# Patient Record
Sex: Female | Born: 1935 | Race: White | Hispanic: No | Smoking: Never smoker
Health system: Southern US, Community
[De-identification: ages and names within clinical notes are randomized; demographics above are authoritative.]

## PROBLEM LIST (undated history)

## (undated) DIAGNOSIS — R42 Dizziness and giddiness: Secondary | ICD-10-CM

## (undated) DIAGNOSIS — C4490 Unspecified malignant neoplasm of skin, unspecified: Secondary | ICD-10-CM

## (undated) DIAGNOSIS — G4700 Insomnia, unspecified: Secondary | ICD-10-CM

## (undated) DIAGNOSIS — K219 Gastro-esophageal reflux disease without esophagitis: Secondary | ICD-10-CM

## (undated) DIAGNOSIS — I10 Essential (primary) hypertension: Secondary | ICD-10-CM

## (undated) DIAGNOSIS — E039 Hypothyroidism, unspecified: Secondary | ICD-10-CM

## (undated) DIAGNOSIS — G709 Myoneural disorder, unspecified: Secondary | ICD-10-CM

## (undated) DIAGNOSIS — M1990 Unspecified osteoarthritis, unspecified site: Secondary | ICD-10-CM

## (undated) DIAGNOSIS — J42 Unspecified chronic bronchitis: Secondary | ICD-10-CM

## (undated) DIAGNOSIS — I499 Cardiac arrhythmia, unspecified: Secondary | ICD-10-CM

## (undated) DIAGNOSIS — K589 Irritable bowel syndrome without diarrhea: Secondary | ICD-10-CM

## (undated) HISTORY — PX: DILATION AND CURETTAGE OF UTERUS: SHX78

## (undated) HISTORY — DX: Unspecified malignant neoplasm of skin, unspecified: C44.90

## (undated) HISTORY — DX: Irritable bowel syndrome, unspecified: K58.9

## (undated) HISTORY — DX: Hypothyroidism, unspecified: E03.9

## (undated) HISTORY — DX: Gastro-esophageal reflux disease without esophagitis: K21.9

## (undated) HISTORY — PX: TONSILLECTOMY: SUR1361

## (undated) HISTORY — DX: Essential (primary) hypertension: I10

## (undated) HISTORY — DX: Unspecified chronic bronchitis: J42

## (undated) HISTORY — PX: CHOLECYSTECTOMY: SHX55

## (undated) HISTORY — PX: COLONOSCOPY: SHX174

## (undated) HISTORY — DX: Unspecified osteoarthritis, unspecified site: M19.90

## (undated) HISTORY — PX: MOHS SURGERY: SHX181

## (undated) HISTORY — PX: TOTAL ABDOMINAL HYSTERECTOMY: SHX209

## (undated) HISTORY — DX: Insomnia, unspecified: G47.00

## (undated) HISTORY — PX: THYROIDECTOMY: SHX17

---

## 2012-01-17 HISTORY — PX: BREAST SURGERY: SHX581

## 2012-08-27 ENCOUNTER — Encounter: Payer: Self-pay | Admitting: Internal Medicine

## 2012-08-27 ENCOUNTER — Telehealth: Payer: Self-pay | Admitting: *Deleted

## 2012-08-27 ENCOUNTER — Ambulatory Visit (INDEPENDENT_AMBULATORY_CARE_PROVIDER_SITE_OTHER): Payer: Medicare Other | Admitting: Internal Medicine

## 2012-08-27 VITALS — BP 126/70 | HR 74 | Ht 63.0 in | Wt 163.8 lb

## 2012-08-27 DIAGNOSIS — M12519 Traumatic arthropathy, unspecified shoulder: Secondary | ICD-10-CM

## 2012-08-27 DIAGNOSIS — M75101 Unspecified rotator cuff tear or rupture of right shoulder, not specified as traumatic: Secondary | ICD-10-CM | POA: Insufficient documentation

## 2012-08-27 DIAGNOSIS — M12811 Other specific arthropathies, not elsewhere classified, right shoulder: Secondary | ICD-10-CM

## 2012-08-27 DIAGNOSIS — I447 Left bundle-branch block, unspecified: Secondary | ICD-10-CM

## 2012-08-27 DIAGNOSIS — Z01818 Encounter for other preprocedural examination: Secondary | ICD-10-CM

## 2012-08-27 DIAGNOSIS — J810 Acute pulmonary edema: Secondary | ICD-10-CM

## 2012-08-27 DIAGNOSIS — I4949 Other premature depolarization: Secondary | ICD-10-CM

## 2012-08-27 DIAGNOSIS — I10 Essential (primary) hypertension: Secondary | ICD-10-CM

## 2012-08-27 DIAGNOSIS — I493 Ventricular premature depolarization: Secondary | ICD-10-CM

## 2012-08-27 DIAGNOSIS — Z01810 Encounter for preprocedural cardiovascular examination: Secondary | ICD-10-CM

## 2012-08-27 NOTE — Telephone Encounter (Signed)
Faxed to [facility] from 08/27/12 per Rennis Golden, MD

## 2012-08-27 NOTE — Progress Notes (Signed)
OFFICE NOTE  Chief Complaint:  Preoperative cardiovascular assessment  Primary Care Physician: Pcp Not In System  HPI:  Ruth Graves is a pleasant 79 year old female was referred to me kindly for evaluation of preoperative cardiovascular risk prior to shoulder surgery. Ms. Boxell has a history of hypertension, PVCs and lower extremity edema. She initially saw cardiologist in [place] years ago and was started on atenolol and amlodipine. She is maintained and is currently followed for parts of the year by a cardiologist in [place], [place]. She reports that she is remote stress test in her primary care doctor's office which was an exercise treadmill. She's never had a nuclear stress test. She has had prior EKGs but none are available at this time. Her EKG today does show a left bundle branch block which may be new. Her cardiologist is not performed at nuclear stress test on her, to her knowledge. She is contemplating shoulder surgery and we are requested to evaluate her risk for that procedure. It is expected this will be general anesthesia. With regards to her symptoms she is fairly asymptomatic, denying any significant chest pain, mild shortness of breath with significant exertion, no presyncope, syncope or palpitations that are significant.  PMHx:  Past Medical History  Diagnosis Date  . Hypertension   . Insomnia   . GERD (gastroesophageal reflux disease)   . OA (osteoarthritis)   . Hypothyroidism   . Skin cancer   . Chronic bronchitis   . IBS (irritable bowel syndrome)     Past Surgical History  Procedure Laterality Date  . Total abdominal hysterectomy    . Tonsillectomy    . Appendectomy    . Cholecystectomy    . Mohs surgery    . Thyroidectomy      FAMHx:  Family History  Problem Relation Age of Onset  . Heart disease      siblings x 5  . Cancer Mother   . Emphysema Brother   . Cancer Brother     x 2   . Kidney disease Brother   . Stroke Sister   . Cancer Sister     . Diabetes Sister     SOCHx:   reports that she has never smoked. She has never used smokeless tobacco. She reports that  drinks alcohol. She reports that she does not use illicit drugs.  ALLERGIES:  Allergies  Allergen Reactions  . Codeine Nausea Only  . Penicillins Nausea Only  . Statins     weakness    ROS: A comprehensive review of systems was negative except for: Respiratory: positive for dyspnea on exertion  HOME MEDS: Current Outpatient Prescriptions  Medication Sig Dispense Refill  . amitriptyline (ELAVIL) 25 MG tablet Take 25 mg by mouth at bedtime.      Marland Kitchen amLODipine (NORVASC) 5 MG tablet Take 5 mg by mouth daily.      Marland Kitchen aspirin 81 MG tablet Take 81 mg by mouth daily.      Marland Kitchen atenolol (TENORMIN) 100 MG tablet Take 100 mg by mouth daily.      . B Complex-C (B-COMPLEX WITH VITAMIN C) tablet Take 1 tablet by mouth daily.      . Calcium Carbonate-Vitamin D (CALCIUM-D) 600-400 MG-UNIT TABS Take by mouth daily.      . Calcium Polycarbophil (FIBER) 625 MG TABS Take 2 tablets by mouth daily.      . Cholecalciferol (VITAMIN D-3) 1000 UNITS CAPS Take 1 capsule by mouth daily.      Marland Kitchen estradiol (ESTRACE)  0.5 MG tablet Take 0.5 mg by mouth daily.      . hydrochlorothiazide (HYDRODIURIL) 25 MG tablet Take 12.5 mg by mouth daily.      Marland Kitchen levothyroxine (SYNTHROID, LEVOTHROID) 100 MCG tablet Take 100 mcg by mouth daily before breakfast.      . meloxicam (MOBIC) 15 MG tablet Take 15 mg by mouth daily.       No current facility-administered medications for this visit.    LABS/IMAGING: No results found for this or any previous visit (from the past 48 hour(s)). No results found.  VITALS: BP 126/70  Pulse 74  Ht 5\' 3"  (1.6 m)  Wt 163 lb 12.8 oz (74.299 kg)  BMI 29.02 kg/m2  EXAM: General appearance: alert and no distress Neck: no adenopathy, no carotid bruit, no JVD, supple, symmetrical, trachea midline and thyroid not enlarged, symmetric, no tenderness/mass/nodules Lungs: clear  to auscultation bilaterally Heart: regular rate and rhythm, S1, S2 normal, no murmur, click, rub or gallop Abdomen: soft, non-tender; bowel sounds normal; no masses,  no organomegaly Extremities: extremities normal, atraumatic, no cyanosis or edema Pulses: 2+ and symmetric Skin: Skin color, texture, turgor normal. No rashes or lesions Neurologic: Grossly normal  EKG: Sinus rhythm at 74 with a left bundle branch block, QRS duration 128 ms  ASSESSMENT: 1. Left bundle branch block 2. Hypertension-controlled 3. Indeterminate risk for orthopedic surgery  PLAN: 1.   Mrs. Hobdy has a left bundle branch block which may be new or old. She has not had a stress test to evaluate this anytime recently. She's not describing any symptoms of angina but does get some mild shortness of breath, which may be due to dyssynchrony. Her blood pressure is well controlled. He go be reasonable to obtain a stress test prior to surgery to rule out any significant obstructive disease that may be leading to her conduction delay. If this is negative, she would likely be low risk for her procedure. We will try to obtain records from her cardiologist in [place].  Thank you for this interesting consult.  Chrystie Nose, MD, [facility] Attending Cardiologist [facility]  Anthony Tamburo C 08/27/2012, 1:10 PM

## 2012-08-27 NOTE — Patient Instructions (Addendum)
Your physician wants you to follow-up in: 2-3 weeks.   You will receive a reminder letter in the mail two months in advance. If you don't receive a letter, please call our office to schedule the follow-up appointment.  Your physician has requested that you have a lexiscan myoview. For further information please visit [email or web address] Please follow instruction sheet, as given.

## 2012-09-02 ENCOUNTER — Encounter: Payer: Self-pay | Admitting: Internal Medicine

## 2012-09-05 ENCOUNTER — Ambulatory Visit (HOSPITAL_COMMUNITY)
Admission: RE | Admit: 2012-09-05 | Discharge: 2012-09-05 | Disposition: A | Discharge status: Home / Self Care | Payer: Medicare Other | Source: Ambulatory Visit | Attending: Internal Medicine | Admitting: Internal Medicine

## 2012-09-05 DIAGNOSIS — Z01818 Encounter for other preprocedural examination: Secondary | ICD-10-CM

## 2012-09-05 DIAGNOSIS — I10 Essential (primary) hypertension: Secondary | ICD-10-CM | POA: Insufficient documentation

## 2012-09-05 DIAGNOSIS — R002 Palpitations: Secondary | ICD-10-CM | POA: Insufficient documentation

## 2012-09-05 DIAGNOSIS — R0989 Other specified symptoms and signs involving the circulatory and respiratory systems: Secondary | ICD-10-CM | POA: Insufficient documentation

## 2012-09-05 DIAGNOSIS — I447 Left bundle-branch block, unspecified: Secondary | ICD-10-CM

## 2012-09-05 DIAGNOSIS — R0609 Other forms of dyspnea: Secondary | ICD-10-CM | POA: Insufficient documentation

## 2012-09-05 DIAGNOSIS — Z01810 Encounter for preprocedural cardiovascular examination: Secondary | ICD-10-CM | POA: Insufficient documentation

## 2012-09-05 DIAGNOSIS — Z8249 Family history of ischemic heart disease and other diseases of the circulatory system: Secondary | ICD-10-CM | POA: Insufficient documentation

## 2012-09-05 DIAGNOSIS — R42 Dizziness and giddiness: Secondary | ICD-10-CM | POA: Insufficient documentation

## 2012-09-05 MED ORDER — AMINOPHYLLINE 25 MG/ML IV SOLN: 75.0000 mg | Freq: Once | INTRAVENOUS | Status: DC

## 2012-09-05 MED ORDER — TECHNETIUM TC 99M SESTAMIBI GENERIC - CARDIOLITE
30.2000 | Freq: Once | INTRAVENOUS | Status: AC | PRN
  Administered 2012-09-05: 30.2 via INTRAVENOUS

## 2012-09-05 MED ORDER — TECHNETIUM TC 99M SESTAMIBI GENERIC - CARDIOLITE
10.9000 | Freq: Once | INTRAVENOUS | Status: AC | PRN
  Administered 2012-09-05: 10.9 via INTRAVENOUS

## 2012-09-05 MED ORDER — REGADENOSON 0.4 MG/5ML IV SOLN
0.4000 mg | Freq: Once | INTRAVENOUS | Status: AC
  Administered 2012-09-05: 0.4 mg via INTRAVENOUS

## 2012-09-05 NOTE — Procedures (Addendum)
[facility] [address] [place] [address] [phone]  Cardiology Nuclear Med Study  Ruth Graves is a 79 y.o. female     MRN : [id number]     DOB: Jul 25, 1928  Procedure Date: 09/05/2012  Nuclear Med Background Indication for Stress Test:  Surgical Clearance History:  COPD and PVC'S Cardiac Risk Factors: Family History - CAD, Hypertension and LBBB  Symptoms:  DOE, Light-Headedness, Palpitations and SOB   Nuclear Pre-Procedure Caffeine/Decaff Intake:  10:00pm NPO After: 8:00am   IV Site: R Antecubital  IV 0.9% NS with Angio Cath:  22g  Chest Size (in):  N/A IV Started by: Emmit Pomfret, RN  Height: 5\' 3"  (1.6 m)  Cup Size: C  BMI:  Body mass index is 28.88 kg/(m^2). Weight:  163 lb (73.936 kg)   Tech Comments:  N/A    Nuclear Med Study 1 or 2 day study: 1 day  Stress Test Type:  Lexiscan  Order Authorizing Provider:  Zoila Shutter, MD   Resting Radionuclide: Technetium 55m Sestamibi  Resting Radionuclide Dose: 10.9 mCi   Stress Radionuclide:  Technetium 89m Sestamibi  Stress Radionuclide Dose: 30.2 mCi           Stress Protocol Rest HR: 81 Stress HR: 85  Rest BP: 166/97 Stress BP: 161/88  Exercise Time (min): n/a METS: n/a   Predicted Max HR: 139 bpm % Max HR: 61.15 bpm Rate Pressure Product: 21308  Dose of Adenosine (mg):  n/a Dose of Lexiscan: 0.4 mg  Dose of Atropine (mg): n/a Dose of Dobutamine: n/a mcg/kg/min (at max HR)  Stress Test Technologist: Esperanza Sheets, CCT Nuclear Technologist: Gonzella Lex, CNMT   Rest Procedure:  Myocardial perfusion imaging was performed at rest 45 minutes following the intravenous administration of Technetium 68m Sestamibi. Stress Procedure:  The patient received IV Lexiscan 0.4 mg over 15-seconds.  Technetium 62m Sestamibi injected at 30-seconds.  There were no significant changes with Lexiscan.  Quantitative spect images were obtained after a 45 minute  delay.  Transient Ischemic Dilatation (Normal <1.22):  0.95  Lung/Heart Ratio (Normal <0.45):  0.25 QGS EDV:  81 ml QGS ESV:  29 ml LV Ejection Fraction: 64%  Signed by Gonzella Lex, CNMT  PHYSICIAN INTERPRETATION  Rest ECG: NSR-LBBB, PACs  Stress ECG: No significant change from baseline ECG and No significant ST segment change suggestive of ischemia.  QPS Raw Data Images:  Significant splanchnic visceral uptake partially obscures the inferior wall. Mild Breast attenuation Stress Images:  There is decreased uptake in the anterior wall.  There is decreased uptake in the septum. There is partial reversibility in this area. There appears to be a medium sized, mild to moderate intensity perfusion defect in the mid anterior -anteroseptal wall.  This distribution is not necessarily consistent with normal vascular distribution unless the affected vessel is a proximal LAD septal perforator with no defect in the distal LAD.   Rest Images:  There is decreased uptake in the anterior wall. Subtraction (SDS):  There is an apparent mid anterior - anteroseptal perfusion defect that is worse in stress vs. resting images.  As noted, this is an unusual vascular distribution.  Cannot exclude shifting breast attenuation or LBBB wall motion related septal perfusion defect.; However, cannot exclude ischemia.    Impression Exercise Capacity:  Lexiscan with no exercise. BP Response:  Normal blood pressure response. Clinical Symptoms:  There is dyspnea. ECG Impression:  No significant ECG changes with Lexiscan. Comparison with Prior  Nuclear Study: No previous nuclear study performed LV Wall Motion:  Normal Function, septal dyskinesis secondary to LBBB related conduction delay.  Overall Impression:  Low risk stress nuclear study There does appear to be a mild to moderate partially reversible perfusion defect in mid anterior-anteroseptal wall that does not extent to the base or apex.  Cannot exclude septal  perforator ischemia, but likley is related to shifting breast attenuation with LBBB wall motion septatl defect.Marland Kitchen   Marykay Lex, MD  09/05/2012 3:45 PM

## 2012-09-09 ENCOUNTER — Encounter: Payer: Self-pay | Admitting: Internal Medicine

## 2012-09-09 ENCOUNTER — Ambulatory Visit (INDEPENDENT_AMBULATORY_CARE_PROVIDER_SITE_OTHER): Payer: Medicare Other | Admitting: Internal Medicine

## 2012-09-09 VITALS — BP 118/64 | HR 68 | Ht 63.0 in | Wt 163.3 lb

## 2012-09-09 DIAGNOSIS — I10 Essential (primary) hypertension: Secondary | ICD-10-CM

## 2012-09-09 DIAGNOSIS — M12811 Other specific arthropathies, not elsewhere classified, right shoulder: Secondary | ICD-10-CM

## 2012-09-09 DIAGNOSIS — M12519 Traumatic arthropathy, unspecified shoulder: Secondary | ICD-10-CM

## 2012-09-09 DIAGNOSIS — I447 Left bundle-branch block, unspecified: Secondary | ICD-10-CM

## 2012-09-09 NOTE — Progress Notes (Signed)
OFFICE NOTE  Chief Complaint:  Preoperative cardiovascular assessment  Primary Care Physician: Pcp Not In System  HPI:  Ruth Graves is a pleasant 79 year old female was referred to me kindly for evaluation of preoperative cardiovascular risk prior to shoulder surgery. Ruth Graves has a history of hypertension, PVCs and lower extremity edema. She initially saw cardiologist in [place] years ago and was started on atenolol and amlodipine. She is maintained and is currently followed for parts of the year by a cardiologist in [place], [place]. She reports that she is remote stress test in her primary care doctor's office which was an exercise treadmill. She's never had a nuclear stress test. She has had prior EKGs but none are available at this time. Her EKG today does show a left bundle branch block which may be new. Her cardiologist is not performed at nuclear stress test on her, to her knowledge. She is contemplating shoulder surgery and we are requested to evaluate her risk for that procedure. It is expected this will be general anesthesia. With regards to her symptoms she is fairly asymptomatic, denying any significant chest pain, mild shortness of breath with significant exertion, no presyncope, syncope or palpitations that are significant.  She underwent LexiScan nuclear stress testing to evaluate her left bundle branch block. This was negative for ischemia and showed a fixed septal defect consistent with bundle branch block. I did receive records from her cardiologist in [facility], which demonstrates the left bundle branch block is old and known.  PMHx:  Past Medical History  Diagnosis Date  . Hypertension   . Insomnia   . GERD (gastroesophageal reflux disease)   . OA (osteoarthritis)   . Hypothyroidism   . Skin cancer   . Chronic bronchitis   . IBS (irritable bowel syndrome)     Past Surgical History  Procedure Laterality Date  . Total abdominal hysterectomy    .  Tonsillectomy    . Appendectomy    . Cholecystectomy    . Mohs surgery    . Thyroidectomy      FAMHx:  Family History  Problem Relation Age of Onset  . Heart disease      siblings x 5  . Cancer Mother   . Emphysema Brother   . Cancer Brother     x 2   . Kidney disease Brother   . Stroke Sister   . Cancer Sister   . Diabetes Sister     SOCHx:   reports that she has never smoked. She has never used smokeless tobacco. She reports that  drinks alcohol. She reports that she does not use illicit drugs.  ALLERGIES:  Allergies  Allergen Reactions  . Codeine Nausea Only  . Penicillins Nausea Only  . Statins     weakness    ROS: A comprehensive review of systems was negative except for: Respiratory: positive for dyspnea on exertion  HOME MEDS: Current Outpatient Prescriptions  Medication Sig Dispense Refill  . amitriptyline (ELAVIL) 25 MG tablet Take 25 mg by mouth at bedtime.      Marland Kitchen amLODipine (NORVASC) 5 MG tablet Take 5 mg by mouth daily.      Marland Kitchen aspirin 81 MG tablet Take 81 mg by mouth daily.      Marland Kitchen atenolol (TENORMIN) 100 MG tablet Take 100 mg by mouth daily.      . B Complex-C (B-COMPLEX WITH VITAMIN C) tablet Take 1 tablet by mouth daily.      . Calcium Carbonate-Vitamin D (CALCIUM-D)  600-400 MG-UNIT TABS Take by mouth daily.      . Calcium Polycarbophil (FIBER) 625 MG TABS Take 2 tablets by mouth daily.      . Cholecalciferol (VITAMIN D-3) 1000 UNITS CAPS Take 1 capsule by mouth daily.      Marland Kitchen estradiol (ESTRACE) 0.5 MG tablet Take 0.5 mg by mouth daily.      . hydrochlorothiazide (HYDRODIURIL) 25 MG tablet Take 12.5 mg by mouth daily.      Marland Kitchen levothyroxine (SYNTHROID, LEVOTHROID) 100 MCG tablet Take 100 mcg by mouth daily before breakfast.      . meloxicam (MOBIC) 15 MG tablet Take 15 mg by mouth daily.      . lansoprazole (PREVACID) 15 MG capsule Take 15 mg by mouth daily.       No current facility-administered medications for this visit.    LABS/IMAGING: No  results found for this or any previous visit (from the past 48 hour(s)). No results found.  VITALS: BP 118/64  Pulse 68  Ht 5\' 3"  (1.6 m)  Wt 163 lb 4.8 oz (74.072 kg)  BMI 28.93 kg/m2  EXAM: deferred  EKG: deferred  ASSESSMENT: 1. Left bundle branch block - low risk nuclear stress test negative for ischemia 2. Hypertension-controlled 3. Low risk for orthopedic surgery  PLAN: 1.   Ruth Graves has a left bundle branch block which apparently is old. She underwent nuclear stress testing which was negative for ischemia in our office. Therefore think she is low risk for the planned shoulder surgery. We will be available as needed if there are any cardiac complications that should arise. She plans to continue to get care from her cardiologist in [place] as well as as needed care for me when she is in [place].  Chrystie Nose, MD, [facility] Attending Cardiologist [facility]  Ruth Graves C 09/09/2012, 10:05 AM

## 2012-09-09 NOTE — Patient Instructions (Signed)
Follow-up as needed. Cleared for surgery.  Dr.  Rennis Golden

## 2012-09-10 ENCOUNTER — Telehealth: Payer: Self-pay | Admitting: *Deleted

## 2012-09-10 NOTE — Telephone Encounter (Signed)
Faxed pre-op clearance form to [facility] on 09/10/12

## 2012-09-12 ENCOUNTER — Other Ambulatory Visit: Payer: Self-pay | Admitting: Physician Assistant

## 2012-09-12 MED ORDER — VANCOMYCIN HCL 10 G IV SOLR: 1000.0000 mg | INTRAVENOUS | Status: AC

## 2012-09-12 MED ORDER — CHLORHEXIDINE GLUCONATE 4 % EX LIQD: 60.0000 mL | Freq: Once | CUTANEOUS | Status: DC

## 2012-09-12 NOTE — H&P (Signed)
Ruth Graves is an 79 y.o. female.    Chief Complaint: left shoulder pain   HPI: Pt is a 79 y.o. female complaining of left shoulder pain for multiple months. Pain had continually increased since the beginning. X-rays in the clinic show rotator cuff tear left shoulder. Pt has tried various conservative treatments which have failed to alleviate their symptoms, including injections and therapy. Various options are discussed with the patient. Risks, benefits and expectations were discussed with the patient. Patient understand the risks, benefits and expectations and wishes to proceed with surgery.   PCP:  Pcp Not In System  D/C Plans:  Home   PMH: Past Medical History  Diagnosis Date  . Hypertension   . Insomnia   . GERD (gastroesophageal reflux disease)   . OA (osteoarthritis)   . Hypothyroidism   . Skin cancer   . Chronic bronchitis   . IBS (irritable bowel syndrome)     PSH: Past Surgical History  Procedure Laterality Date  . Total abdominal hysterectomy    . Tonsillectomy    . Appendectomy    . Cholecystectomy    . Mohs surgery    . Thyroidectomy      Social History:  reports that she has never smoked. She has never used smokeless tobacco. She reports that  drinks alcohol. She reports that she does not use illicit drugs.  Allergies:  Allergies  Allergen Reactions  . Codeine Nausea Only  . Penicillins Nausea Only  . Statins     weakness    Medications: Current Outpatient Prescriptions  Medication Sig Dispense Refill  . amitriptyline (ELAVIL) 25 MG tablet Take 25 mg by mouth at bedtime.      Marland Kitchen amLODipine (NORVASC) 5 MG tablet Take 5 mg by mouth daily.      Marland Kitchen aspirin 81 MG tablet Take 81 mg by mouth daily.      Marland Kitchen atenolol (TENORMIN) 100 MG tablet Take 100 mg by mouth daily.      . B Complex-C (B-COMPLEX WITH VITAMIN C) tablet Take 1 tablet by mouth daily.      . Calcium Carbonate-Vitamin D (CALCIUM-D) 600-400 MG-UNIT TABS Take by mouth daily.      . Calcium  Polycarbophil (FIBER) 625 MG TABS Take 2 tablets by mouth daily.      . Cholecalciferol (VITAMIN D-3) 1000 UNITS CAPS Take 1 capsule by mouth daily.      Marland Kitchen estradiol (ESTRACE) 0.5 MG tablet Take 0.5 mg by mouth daily.      . hydrochlorothiazide (HYDRODIURIL) 25 MG tablet Take 12.5 mg by mouth daily.      . lansoprazole (PREVACID) 15 MG capsule Take 15 mg by mouth daily.      Marland Kitchen levothyroxine (SYNTHROID, LEVOTHROID) 100 MCG tablet Take 100 mcg by mouth daily before breakfast.      . meloxicam (MOBIC) 15 MG tablet Take 15 mg by mouth daily.       Current Facility-Administered Medications  Medication Dose Route Frequency Provider Last Rate Last Dose  . chlorhexidine (HIBICLENS) 4 % liquid 4 application  60 mL Topical Once Thea Gist, PA-C      . vancomycin (VANCOCIN) 1,000 mg in sodium chloride 0.9 % 500 mL IVPB  1,000 mg Intravenous On Call to OR Thea Gist, PA-C        No results found for this or any previous visit (from the past 48 hour(s)). No results found.  ROS: Pain with rom of the left upper extremity  Physical  Exam:  Alert and oriented 79 y.o. female in no acute distress Cranial nerves 2-12 intact Cervical spine: full rom with no tenderness, nv intact distally Chest: active breath sounds bilaterally, no wheeze rhonchi or rales Heart: regular rate and rhythm, no murmur Abd: non tender non distended with active bowel sounds Hip is stable with rom  Left shoulder with decreased rom and weakness nv intact distally ER and IR strength 4/5 as compared to right  No rashes or edema   Assessment/Plan Assessment: left shoulder pain secondary to rotator cuff tear  Plan: Patient will undergo a left shoulder arthroscopy and open cuff repair by Dr. Ranell Patrick at [facility]. Risks benefits and expectations were discussed with the patient. Patient understand risks, benefits and expectations and wishes to proceed.

## 2012-09-13 ENCOUNTER — Encounter (HOSPITAL_COMMUNITY): Payer: Self-pay | Admitting: Pharmacy Technician

## 2012-09-19 ENCOUNTER — Encounter (HOSPITAL_COMMUNITY): Payer: Self-pay | Admitting: *Deleted

## 2012-09-20 ENCOUNTER — Ambulatory Visit (HOSPITAL_COMMUNITY)
Admission: RE | Admit: 2012-09-20 | Discharge: 2012-09-20 | Disposition: A | Discharge status: Home / Self Care | Payer: Medicare Other | Source: Ambulatory Visit | Attending: Orthopedic Surgery | Admitting: Orthopedic Surgery

## 2012-09-20 ENCOUNTER — Ambulatory Visit (HOSPITAL_COMMUNITY): Payer: Medicare Other | Admitting: Anesthesiology

## 2012-09-20 ENCOUNTER — Encounter (HOSPITAL_COMMUNITY)
Admission: RE | Disposition: A | Discharge status: Home / Self Care | Payer: Self-pay | Source: Ambulatory Visit | Attending: Orthopedic Surgery

## 2012-09-20 ENCOUNTER — Ambulatory Visit (HOSPITAL_COMMUNITY): Payer: Medicare Other

## 2012-09-20 ENCOUNTER — Encounter (HOSPITAL_COMMUNITY): Payer: Self-pay | Admitting: *Deleted

## 2012-09-20 ENCOUNTER — Encounter (HOSPITAL_COMMUNITY): Payer: Self-pay | Admitting: Anesthesiology

## 2012-09-20 DIAGNOSIS — I10 Essential (primary) hypertension: Secondary | ICD-10-CM | POA: Insufficient documentation

## 2012-09-20 DIAGNOSIS — M7500 Adhesive capsulitis of unspecified shoulder: Secondary | ICD-10-CM | POA: Insufficient documentation

## 2012-09-20 DIAGNOSIS — Z880 Allergy status to penicillin: Secondary | ICD-10-CM | POA: Insufficient documentation

## 2012-09-20 DIAGNOSIS — K219 Gastro-esophageal reflux disease without esophagitis: Secondary | ICD-10-CM | POA: Insufficient documentation

## 2012-09-20 DIAGNOSIS — J42 Unspecified chronic bronchitis: Secondary | ICD-10-CM | POA: Insufficient documentation

## 2012-09-20 DIAGNOSIS — Y929 Unspecified place or not applicable: Secondary | ICD-10-CM | POA: Insufficient documentation

## 2012-09-20 DIAGNOSIS — E039 Hypothyroidism, unspecified: Secondary | ICD-10-CM | POA: Insufficient documentation

## 2012-09-20 DIAGNOSIS — Z85828 Personal history of other malignant neoplasm of skin: Secondary | ICD-10-CM | POA: Insufficient documentation

## 2012-09-20 DIAGNOSIS — Z888 Allergy status to other drugs, medicaments and biological substances status: Secondary | ICD-10-CM | POA: Insufficient documentation

## 2012-09-20 DIAGNOSIS — Z791 Long term (current) use of non-steroidal anti-inflammatories (NSAID): Secondary | ICD-10-CM | POA: Insufficient documentation

## 2012-09-20 DIAGNOSIS — Z79899 Other long term (current) drug therapy: Secondary | ICD-10-CM | POA: Insufficient documentation

## 2012-09-20 DIAGNOSIS — Z885 Allergy status to narcotic agent status: Secondary | ICD-10-CM | POA: Insufficient documentation

## 2012-09-20 DIAGNOSIS — M1990 Unspecified osteoarthritis, unspecified site: Secondary | ICD-10-CM | POA: Insufficient documentation

## 2012-09-20 DIAGNOSIS — S43439A Superior glenoid labrum lesion of unspecified shoulder, initial encounter: Secondary | ICD-10-CM | POA: Insufficient documentation

## 2012-09-20 DIAGNOSIS — K589 Irritable bowel syndrome without diarrhea: Secondary | ICD-10-CM | POA: Insufficient documentation

## 2012-09-20 DIAGNOSIS — X58XXXA Exposure to other specified factors, initial encounter: Secondary | ICD-10-CM | POA: Insufficient documentation

## 2012-09-20 DIAGNOSIS — G4700 Insomnia, unspecified: Secondary | ICD-10-CM | POA: Insufficient documentation

## 2012-09-20 DIAGNOSIS — Z7982 Long term (current) use of aspirin: Secondary | ICD-10-CM | POA: Insufficient documentation

## 2012-09-20 DIAGNOSIS — S43429A Sprain of unspecified rotator cuff capsule, initial encounter: Secondary | ICD-10-CM | POA: Insufficient documentation

## 2012-09-20 HISTORY — PX: SHOULDER ARTHROSCOPY WITH OPEN ROTATOR CUFF REPAIR: SHX6092

## 2012-09-20 HISTORY — DX: Dizziness and giddiness: R42

## 2012-09-20 HISTORY — DX: Cardiac arrhythmia, unspecified: I49.9

## 2012-09-20 HISTORY — DX: Myoneural disorder, unspecified: G70.9

## 2012-09-20 LAB — CBC
HCT: 41.2 % (ref 36.0–46.0)
Hemoglobin: 14.6 g/dL (ref 12.0–15.0)
RBC: 4.67 MIL/uL (ref 3.87–5.11)
RDW: 12.7 % (ref 11.5–15.5)
WBC: 10.5 10*3/uL (ref 4.0–10.5)

## 2012-09-20 LAB — BASIC METABOLIC PANEL
BUN: 13 mg/dL (ref 6–23)
CO2: 27 mEq/L (ref 19–32)
Chloride: 98 mEq/L (ref 96–112)
GFR calc Af Amer: >90 mL/min (ref >90)
Glucose, Bld: 127 mg/dL — ABNORMAL HIGH (ref 70–99)
Potassium: 3.9 mEq/L (ref 3.5–5.1)

## 2012-09-20 SURGERY — ARTHROSCOPY, SHOULDER WITH REPAIR, ROTATOR CUFF, OPEN
Anesthesia: General | Site: Shoulder | Laterality: Left | Wound class: Clean

## 2012-09-20 MED ORDER — MIDAZOLAM HCL 2 MG/2ML IJ SOLN
1.0000 mg | INTRAMUSCULAR | Status: DC | PRN
  Administered 2012-09-20: 1 mg via INTRAVENOUS
  Filled 2012-09-20: qty 2

## 2012-09-20 MED ORDER — FENTANYL CITRATE 0.05 MG/ML IJ SOLN
50.0000 ug | Freq: Once | INTRAMUSCULAR | Status: AC
  Administered 2012-09-20: 50 ug via INTRAVENOUS
  Filled 2012-09-20: qty 2

## 2012-09-20 MED ORDER — BUPIVACAINE-EPINEPHRINE PF 0.25-1:200000 % IJ SOLN
INTRAMUSCULAR | Status: AC
  Filled 2012-09-20: qty 30

## 2012-09-20 MED ORDER — LACTATED RINGERS IV SOLN
INTRAVENOUS | Status: DC | PRN
  Administered 2012-09-20 (×2): via INTRAVENOUS

## 2012-09-20 MED ORDER — ONDANSETRON HCL 4 MG/2ML IJ SOLN
INTRAMUSCULAR | Status: DC | PRN
  Administered 2012-09-20: 4 mg via INTRAVENOUS

## 2012-09-20 MED ORDER — LIDOCAINE HCL (CARDIAC) 20 MG/ML IV SOLN
INTRAVENOUS | Status: DC | PRN
  Administered 2012-09-20: 20 mg via INTRAVENOUS

## 2012-09-20 MED ORDER — PHENYLEPHRINE HCL 10 MG/ML IJ SOLN
INTRAMUSCULAR | Status: DC | PRN
  Administered 2012-09-20 (×5): 80 ug via INTRAVENOUS

## 2012-09-20 MED ORDER — OXYCODONE-ACETAMINOPHEN 5-325 MG PO TABS: 1.0000 | ORAL_TABLET | ORAL | Status: AC | PRN

## 2012-09-20 MED ORDER — VANCOMYCIN HCL 1000 MG IV SOLR
1000.0000 mg | INTRAVENOUS | Status: DC | PRN
  Administered 2012-09-20: 1000 mg via INTRAVENOUS

## 2012-09-20 MED ORDER — EPHEDRINE SULFATE 50 MG/ML IJ SOLN
INTRAMUSCULAR | Status: DC | PRN
  Administered 2012-09-20 (×2): 10 mg via INTRAVENOUS

## 2012-09-20 MED ORDER — LIDOCAINE HCL 0.5 % IJ SOLN
INTRAMUSCULAR | Status: DC | PRN
  Administered 2012-09-20: 4 mL

## 2012-09-20 MED ORDER — BUPIVACAINE HCL (PF) 0.25 % IJ SOLN
INTRAMUSCULAR | Status: AC
  Filled 2012-09-20: qty 30

## 2012-09-20 MED ORDER — BUPIVACAINE-EPINEPHRINE PF 0.5-1:200000 % IJ SOLN
INTRAMUSCULAR | Status: DC | PRN
  Administered 2012-09-20: 30 mL

## 2012-09-20 MED ORDER — METHYLPREDNISOLONE ACETATE 40 MG/ML IJ SUSP
INTRAMUSCULAR | Status: DC | PRN
  Administered 2012-09-20: 40 mg

## 2012-09-20 MED ORDER — NEOSTIGMINE METHYLSULFATE 1 MG/ML IJ SOLN
INTRAMUSCULAR | Status: DC | PRN
  Administered 2012-09-20: 2 mg via INTRAVENOUS

## 2012-09-20 MED ORDER — LIDOCAINE HCL (PF) 0.5 % IJ SOLN
INTRAMUSCULAR | Status: AC
  Filled 2012-09-20: qty 50

## 2012-09-20 MED ORDER — FENTANYL CITRATE 0.05 MG/ML IJ SOLN
INTRAMUSCULAR | Status: DC | PRN
Start: 2012-09-20 — End: 2012-09-20
  Administered 2012-09-20: 100 ug via INTRAVENOUS

## 2012-09-20 MED ORDER — METHOCARBAMOL 500 MG PO TABS: 500.0000 mg | ORAL_TABLET | Freq: Three times a day (TID) | ORAL | Status: AC | PRN

## 2012-09-20 MED ORDER — METHYLPREDNISOLONE ACETATE 40 MG/ML IJ SUSP
INTRAMUSCULAR | Status: AC
  Filled 2012-09-20: qty 1

## 2012-09-20 MED ORDER — BUPIVACAINE-EPINEPHRINE 0.25% -1:200000 IJ SOLN
INTRAMUSCULAR | Status: DC | PRN
  Administered 2012-09-20: 8 mL

## 2012-09-20 MED ORDER — SODIUM CHLORIDE 0.9 % IV SOLN
10.0000 mg | INTRAVENOUS | Status: DC | PRN
  Administered 2012-09-20: 10 ug/min via INTRAVENOUS

## 2012-09-20 MED ORDER — PROPOFOL 10 MG/ML IV BOLUS
INTRAVENOUS | Status: DC | PRN
  Administered 2012-09-20: 100 mg via INTRAVENOUS

## 2012-09-20 MED ORDER — VANCOMYCIN HCL IN DEXTROSE 1-5 GM/200ML-% IV SOLN
INTRAVENOUS | Status: AC
  Filled 2012-09-20: qty 200

## 2012-09-20 MED ORDER — GLYCOPYRROLATE 0.2 MG/ML IJ SOLN
INTRAMUSCULAR | Status: DC | PRN
  Administered 2012-09-20: 0.4 mg via INTRAVENOUS

## 2012-09-20 MED ORDER — SODIUM CHLORIDE 0.9 % IR SOLN
Status: DC | PRN
  Administered 2012-09-20: 6000 mL

## 2012-09-20 MED ORDER — ROCURONIUM BROMIDE 100 MG/10ML IV SOLN
INTRAVENOUS | Status: DC | PRN
  Administered 2012-09-20: 40 mg via INTRAVENOUS

## 2012-09-20 MED ORDER — LACTATED RINGERS IV SOLN
INTRAVENOUS | Status: DC
  Administered 2012-09-20: 13:00:00 via INTRAVENOUS

## 2012-09-20 MED ORDER — BUPIVACAINE HCL 0.25 % IJ SOLN
INTRAMUSCULAR | Status: DC | PRN
  Administered 2012-09-20: 4 mL

## 2012-09-20 SURGICAL SUPPLY — 63 items
ANCHOR ALL- SUT RC 2 SUT Y-K (Anchor) ×2 IMPLANT
ANCHOR ALL-SUT FLEX 1.3 Y-KNOT (Anchor) ×2 IMPLANT
ANCHOR ALL-SUT RC 2 SUT Y-K (Anchor) ×2 IMPLANT
BIT DRILL 1.3M DISPOSABLE (BIT) ×2 IMPLANT
BLADE LONG MED 31X9 (MISCELLANEOUS) IMPLANT
BLADE SURG 11 STRL SS (BLADE) ×2 IMPLANT
BUR OVAL 4.0 (BURR) ×2 IMPLANT
CLOTH BEACON ORANGE TIMEOUT ST (SAFETY) ×2 IMPLANT
COVER SURGICAL LIGHT HANDLE (MISCELLANEOUS) ×2 IMPLANT
DRAPE INCISE IOBAN 66X45 STRL (DRAPES) ×2 IMPLANT
DRAPE STERI 35X30 U-POUCH (DRAPES) ×2 IMPLANT
DRAPE U-SHAPE 47X51 STRL (DRAPES) ×2 IMPLANT
DRILL BIT 5/64 (BIT) IMPLANT
DRSG EMULSION OIL 3X3 NADH (GAUZE/BANDAGES/DRESSINGS) ×2 IMPLANT
DRSG PAD ABDOMINAL 8X10 ST (GAUZE/BANDAGES/DRESSINGS) ×4 IMPLANT
DURAPREP 26ML APPLICATOR (WOUND CARE) ×2 IMPLANT
ELECT NEEDLE TIP 2.8 STRL (NEEDLE) ×2 IMPLANT
ELECT REM PT RETURN 9FT ADLT (ELECTROSURGICAL)
ELECTRODE REM PT RTRN 9FT ADLT (ELECTROSURGICAL) IMPLANT
GLOVE BIOGEL PI ORTHO PRO 7.5 (GLOVE) ×1
GLOVE BIOGEL PI ORTHO PRO SZ8 (GLOVE) ×1
GLOVE ORTHO TXT STRL SZ7.5 (GLOVE) ×2 IMPLANT
GLOVE PI ORTHO PRO STRL 7.5 (GLOVE) ×1 IMPLANT
GLOVE PI ORTHO PRO STRL SZ8 (GLOVE) ×1 IMPLANT
GLOVE SURG ORTHO 8.5 STRL (GLOVE) ×2 IMPLANT
GOWN STRL NON-REIN LRG LVL3 (GOWN DISPOSABLE) ×4 IMPLANT
GOWN STRL REIN XL XLG (GOWN DISPOSABLE) ×4 IMPLANT
KIT BASIN OR (CUSTOM PROCEDURE TRAY) ×2 IMPLANT
KIT JUGGERKNOT DISP 2.9MM (KITS) IMPLANT
KIT ROOM TURNOVER OR (KITS) ×2 IMPLANT
MANIFOLD NEPTUNE II (INSTRUMENTS) ×2 IMPLANT
NDL SUT 6 .5 CRC .975X.05 MAYO (NEEDLE) ×1 IMPLANT
NEEDLE HYPO 25GX1X1/2 BEV (NEEDLE) ×2 IMPLANT
NEEDLE MAYO TAPER (NEEDLE) ×1
NEEDLE SPNL 18GX3.5 QUINCKE PK (NEEDLE) ×2 IMPLANT
NS IRRIG 1000ML POUR BTL (IV SOLUTION) ×2 IMPLANT
PACK SHOULDER (CUSTOM PROCEDURE TRAY) ×2 IMPLANT
PAD ARMBOARD 7.5X6 YLW CONV (MISCELLANEOUS) ×4 IMPLANT
RESECTOR FULL RADIUS 4.2MM (BLADE) IMPLANT
SET ARTHROSCOPY TUBING (MISCELLANEOUS) ×1
SET ARTHROSCOPY TUBING LN (MISCELLANEOUS) ×1 IMPLANT
SLING ARM FOAM STRAP LRG (SOFTGOODS) IMPLANT
SLING ARM FOAM STRAP MED (SOFTGOODS) IMPLANT
SPONGE GAUZE 4X4 12PLY (GAUZE/BANDAGES/DRESSINGS) ×2 IMPLANT
SPONGE LAP 4X18 X RAY DECT (DISPOSABLE) IMPLANT
STRIP CLOSURE SKIN 1/2X4 (GAUZE/BANDAGES/DRESSINGS) ×2 IMPLANT
SUCTION FRAZIER TIP 10 FR DISP (SUCTIONS) ×2 IMPLANT
SUT BONE WAX W31G (SUTURE) IMPLANT
SUT FIBERWIRE #2 38 T-5 BLUE (SUTURE)
SUT MNCRL AB 4-0 PS2 18 (SUTURE) ×2 IMPLANT
SUT VIC AB 0 CT1 27 (SUTURE) ×1
SUT VIC AB 0 CT1 27XBRD ANBCTR (SUTURE) ×1 IMPLANT
SUT VIC AB 0 CT2 27 (SUTURE) ×2 IMPLANT
SUT VIC AB 2-0 CT1 27 (SUTURE) ×1
SUT VIC AB 2-0 CT1 TAPERPNT 27 (SUTURE) ×1 IMPLANT
SUT VICRYL 0 CT 1 36IN (SUTURE) ×2 IMPLANT
SUTURE FIBERWR #2 38 T-5 BLUE (SUTURE) IMPLANT
SYR CONTROL 10ML LL (SYRINGE) ×2 IMPLANT
TOWEL OR 17X24 6PK STRL BLUE (TOWEL DISPOSABLE) ×2 IMPLANT
TOWEL OR 17X26 10 PK STRL BLUE (TOWEL DISPOSABLE) ×2 IMPLANT
TUBE CONNECTING 12X1/4 (SUCTIONS) ×2 IMPLANT
WAND 90 DEG TURBOVAC W/CORD (SURGICAL WAND) ×2 IMPLANT
WATER STERILE IRR 1000ML POUR (IV SOLUTION) ×2 IMPLANT

## 2012-09-20 NOTE — Progress Notes (Signed)
Pt states that it is her right shoulder that will need surgery.  Spoke with Dr Ranell Patrick and [facility] and states to change everything to right shoulder new consent obtained.

## 2012-09-20 NOTE — Anesthesia Preprocedure Evaluation (Signed)
Anesthesia Evaluation  Patient identified by MRN, date of birth, ID band Patient awake    Reviewed: Allergy & Precautions, H&P , NPO status , Patient's Chart, lab work & pertinent test results, reviewed documented beta blocker date and time   History of Anesthesia Complications Negative for: history of anesthetic complications  Airway Mallampati: II TM Distance: >3 FB Neck ROM: Full    Dental  (+) Caps, Implants and Dental Advisory Given   Pulmonary COPD (rarely uses inhaler) COPD inhaler,  breath sounds clear to auscultation  Pulmonary exam normal       Cardiovascular hypertension, Pt. on medications and Pt. on home beta blockers - anginaRhythm:Regular Rate:Normal  8/14 stress test: low risk, possible breast attenuation vs ischemia, EF 64%   Neuro/Psych negative neurological ROS     GI/Hepatic Neg liver ROS, GERD-  Medicated and Controlled,  Endo/Other  Hypothyroidism   Renal/GU negative Renal ROS     Musculoskeletal  (+) Arthritis -,   Abdominal   Peds  Hematology   Anesthesia Other Findings   Reproductive/Obstetrics                           Anesthesia Physical Anesthesia Plan  ASA: III  Anesthesia Plan: General   Post-op Pain Management:    Induction: Intravenous  Airway Management Planned: Oral ETT and Video Laryngoscope Planned  Additional Equipment:   Intra-op Plan:   Post-operative Plan: Extubation in OR  Informed Consent: I have reviewed the patients History and Physical, chart, labs and discussed the procedure including the risks, benefits and alternatives for the proposed anesthesia with the patient or authorized representative who has indicated his/her understanding and acceptance.   Dental advisory given  Plan Discussed with: CRNA and Surgeon  Anesthesia Plan Comments: (Plan routine monitors, GETA with interscalene block for post op analgesia)         Anesthesia Quick Evaluation

## 2012-09-20 NOTE — Preoperative (Signed)
Beta Blockers   Reason not to administer Beta Blockers:BB taken on 09/19/12 at 2100.

## 2012-09-20 NOTE — Interval H&P Note (Signed)
History and Physical Interval Note:  09/20/2012 2:23 PM  Ruth Graves  has presented today for surgery, with the diagnosis of LEFT SHOULDER ROTATOR CUFF TEAR  The various methods of treatment have been discussed with the patient and family. After consideration of risks, benefits and other options for treatment, the patient has consented to  Procedure(s): LEFT SHOULDER ARTHROSCOPY WITH SUBACROMIAL DECOMPRESSION OPEN ROTATOR CUFF REPAIR AND OPEN BICEPS TENODESIS (Left) as a surgical intervention .The patient also reports severe right shoulder pain and would like an injection in her right shoulder.  The patient's history has been reviewed, patient examined, no change in status, stable for surgery.  I have reviewed the patient's chart and labs.  Questions were answered to the patient's satisfaction.     Jayshaun Phillips,STEVEN R

## 2012-09-20 NOTE — Brief Op Note (Signed)
09/20/2012  5:14 PM  PATIENT:  Ruth Graves  79 y.o. female  PRE-OPERATIVE DIAGNOSIS:  LEFT SHOULDER ROTATOR CUFF TEAR, SLAP LESION, FROZEN SHOULDER, RIGHT SHOULDER PAIN DUE TO IRREPARABLE RCT  POST-OPERATIVE DIAGNOSIS:  left shoulder rotator cuff tear, SLAP LESION, FROZEN SHOULDER, RIGHT SHOULDER PAIN DUE TO IRREPARABLE RCT   PROCEDURE:  Right shoulder subacromial cortisone injection,  LEFT shoulder scope, extensive debridement of SLAP, bi tenotomy, capsular release, A-SAD, mini-open RCR, biceps tenodesis,   SURGEON:  Surgeon(s) and Role:    * Verlee Rossetti, MD - Primary  PHYSICIAN ASSISTANT:   ASSISTANTS: Thea Gist, PA-C   ANESTHESIA:   regional and general  EBL:  Total I/O In: 1000 [I.V.:1000] Out: -   BLOOD ADMINISTERED:none  DRAINS: none   LOCAL MEDICATIONS USED:  MARCAINE     SPECIMEN:  No Specimen  DISPOSITION OF SPECIMEN:  N/A  COUNTS:  YES  TOURNIQUET:  * No tourniquets in log *  DICTATION: .Other Dictation: Dictation Number [id number]  PLAN OF CARE: Discharge to home after PACU  PATIENT DISPOSITION:  PACU - hemodynamically stable.   Delay start of Pharmacological VTE agent (>24hrs) due to surgical blood loss or risk of bleeding: not applicable

## 2012-09-20 NOTE — H&P (View-Only) (Signed)
Ruth Graves is an 79 y.o. female.    Chief Complaint: left shoulder pain   HPI: Pt is a 79 y.o. female complaining of left shoulder pain for multiple months. Pain had continually increased since the beginning. X-rays in the clinic show rotator cuff tear left shoulder. Pt has tried various conservative treatments which have failed to alleviate their symptoms, including injections and therapy. Various options are discussed with the patient. Risks, benefits and expectations were discussed with the patient. Patient understand the risks, benefits and expectations and wishes to proceed with surgery.   PCP:  Pcp Not In System  D/C Plans:  Home   PMH: Past Medical History  Diagnosis Date  . Hypertension   . Insomnia   . GERD (gastroesophageal reflux disease)   . OA (osteoarthritis)   . Hypothyroidism   . Skin cancer   . Chronic bronchitis   . IBS (irritable bowel syndrome)     PSH: Past Surgical History  Procedure Laterality Date  . Total abdominal hysterectomy    . Tonsillectomy    . Appendectomy    . Cholecystectomy    . Mohs surgery    . Thyroidectomy      Social History:  reports that she has never smoked. She has never used smokeless tobacco. She reports that  drinks alcohol. She reports that she does not use illicit drugs.  Allergies:  Allergies  Allergen Reactions  . Codeine Nausea Only  . Penicillins Nausea Only  . Statins     weakness    Medications: Current Outpatient Prescriptions  Medication Sig Dispense Refill  . amitriptyline (ELAVIL) 25 MG tablet Take 25 mg by mouth at bedtime.      . amLODipine (NORVASC) 5 MG tablet Take 5 mg by mouth daily.      . aspirin 81 MG tablet Take 81 mg by mouth daily.      . atenolol (TENORMIN) 100 MG tablet Take 100 mg by mouth daily.      . B Complex-C (B-COMPLEX WITH VITAMIN C) tablet Take 1 tablet by mouth daily.      . Calcium Carbonate-Vitamin D (CALCIUM-D) 600-400 MG-UNIT TABS Take by mouth daily.      . Calcium  Polycarbophil (FIBER) 625 MG TABS Take 2 tablets by mouth daily.      . Cholecalciferol (VITAMIN D-3) 1000 UNITS CAPS Take 1 capsule by mouth daily.      . estradiol (ESTRACE) 0.5 MG tablet Take 0.5 mg by mouth daily.      . hydrochlorothiazide (HYDRODIURIL) 25 MG tablet Take 12.5 mg by mouth daily.      . lansoprazole (PREVACID) 15 MG capsule Take 15 mg by mouth daily.      . levothyroxine (SYNTHROID, LEVOTHROID) 100 MCG tablet Take 100 mcg by mouth daily before breakfast.      . meloxicam (MOBIC) 15 MG tablet Take 15 mg by mouth daily.       Current Facility-Administered Medications  Medication Dose Route Frequency Provider Last Rate Last Dose  . chlorhexidine (HIBICLENS) 4 % liquid 4 application  60 mL Topical Once [place] Hotz B Cacie Gaskins, PA-C      . vancomycin (VANCOCIN) 1,000 mg in sodium chloride 0.9 % 500 mL IVPB  1,000 mg Intravenous On Call to OR Wynton Hufstetler B Libni Fusaro, PA-C        No results found for this or any previous visit (from the past 48 hour(s)). No results found.  ROS: Pain with rom of the left upper extremity  Physical   Exam:  Alert and oriented 79 y.o. female in no acute distress Cranial nerves 2-12 intact Cervical spine: full rom with no tenderness, nv intact distally Chest: active breath sounds bilaterally, no wheeze rhonchi or rales Heart: regular rate and rhythm, no murmur Abd: non tender non distended with active bowel sounds Hip is stable with rom  Left shoulder with decreased rom and weakness nv intact distally ER and IR strength 4/5 as compared to right  No rashes or edema   Assessment/Plan Assessment: left shoulder pain secondary to rotator cuff tear  Plan: Patient will undergo a left shoulder arthroscopy and open cuff repair by Dr. Norris at [facility]. Risks benefits and expectations were discussed with the patient. Patient understand risks, benefits and expectations and wishes to proceed.   

## 2012-09-20 NOTE — Progress Notes (Signed)
Orthopedic Tech Progress Note Patient Details:  Ruth Graves [birth year] [id number] Delivered to OR front desk Ortho Devices Type of Ortho Device: Shoulder abduction pillow Ortho Device/Splint Interventions: Ordered   [place] 09/20/2012, 11:16 AM

## 2012-09-20 NOTE — Transfer of Care (Signed)
Immediate Anesthesia Transfer of Care Note  Patient: Ruth Graves  Procedure(s) Performed: Procedure(s): LEFT SHOULDER ARTHROSCOPY WITH SUBACROMIAL DECOMPRESSION OPEN ROTATOR CUFF REPAIR AND OPEN BICEPS TENODESIS and right shoulder cortisone injection (Left)  Patient Location: PACU  Anesthesia Type:General  Level of Consciousness: awake, alert  and oriented  Airway & Oxygen Therapy: Patient Spontanous Breathing and Patient connected to nasal cannula oxygen  Post-op Assessment: Report given to PACU RN, Post -op Vital signs reviewed and stable and Patient moving all extremities X 4  Post vital signs: Reviewed and stable  Complications: No apparent anesthesia complications

## 2012-09-20 NOTE — Anesthesia Procedure Notes (Addendum)
Anesthesia Regional Block:  Interscalene brachial plexus block  Pre-Anesthetic Checklist: ,, timeout performed, Correct Patient, Correct Site, Correct Laterality, Correct Procedure, Correct Position, site marked, Risks and benefits discussed,  Surgical consent,  Pre-op evaluation,  At surgeon's request and post-op pain management  Laterality: Left  Prep: chloraprep       Needles:  Injection technique: Single-shot  Needle Type: Stimulator Needle - 40      Needle Gauge: 22 and 22 G    Additional Needles:  Procedures: nerve stimulator Interscalene brachial plexus block  Nerve Stimulator or Paresthesia:  Response: forearm twitch, 0.45 mA, 0.1 ms,   Additional Responses:   Narrative:  Start time: 09/20/2012 2:08 PM End time: 09/20/2012 2:14 PM Injection made incrementally with aspirations every 5 mL.  Performed by: Personally  Anesthesiologist: Sandford Craze, MD  Additional Notes: Pt identified in Holding room.  Monitors applied. Working IV access confirmed. Sterile prep L neck.  #22ga PNS to forearm twitch at 0.22mA threshold.  30cc 0.5% Bupivacaine with 1:200k epi injected incrementally after negative test dose.  Patient asymptomatic, VSS, no heme aspirated, tolerated well.  Sandford Craze, MD  Interscalene brachial plexus block Procedure Name: Intubation Date/Time: 09/20/2012 2:50 PM Performed by: Sharlene Dory E Pre-anesthesia Checklist: Patient identified, Emergency Drugs available, Suction available, Patient being monitored and Timeout performed Patient Re-evaluated:Patient Re-evaluated prior to inductionOxygen Delivery Method: Circle system utilized Preoxygenation: Pre-oxygenation with 100% oxygen Intubation Type: IV induction Ventilation: Mask ventilation without difficulty Tube type: Oral Tube size: 7.0 mm Number of attempts: 2 Airway Equipment and Method: Stylet and Video-laryngoscopy Placement Confirmation: ETT inserted through vocal cords under direct vision,  positive ETCO2  and breath sounds checked- equal and bilateral Secured at: 20 cm Tube secured with: Tape Dental Injury: Teeth and Oropharynx as per pre-operative assessment  Comments: DL x1 with MAC 3, unable to visualize vocal cords per CRNA. DL x1 with glidescope, grade 1 view. AOI per CRNA. +ETCO2 & BBS =.

## 2012-09-20 NOTE — Anesthesia Postprocedure Evaluation (Signed)
  Anesthesia Post-op Note  Patient: Ruth Graves  Procedure(s) Performed: Procedure(s): LEFT SHOULDER ARTHROSCOPY WITH SUBACROMIAL DECOMPRESSION OPEN ROTATOR CUFF REPAIR AND OPEN BICEPS TENODESIS and right shoulder cortisone injection (Left)  Patient Location: PACU  Anesthesia Type:General  Level of Consciousness: awake  Airway and Oxygen Therapy: Patient Spontanous Breathing  Post-op Pain: mild  Post-op Assessment: Post-op Vital signs reviewed  Post-op Vital Signs: Reviewed  Complications: No apparent anesthesia complications

## 2012-09-21 NOTE — Op Note (Signed)
NAMENEAVE, LENGER                 ACCOUNT NO.:  [id number]  MEDICAL RECORD NO.:  [id number]  LOCATION:  [facility]                         FACILITY:  [facility]  PHYSICIAN:  Almedia Balls. Ranell Patrick, M.D. DATE OF BIRTH:  21-Dec-1928  DATE OF PROCEDURE:  09/20/2012 DATE OF DISCHARGE:  09/20/2012                              OPERATIVE REPORT   PREOPERATIVE DIAGNOSES: 1. Right shoulder pain secondary to irreparable rotator cuff tear. 2. Left shoulder rotator cuff tear. 3. Left shoulder SLAP lesion.  POSTOPERATIVE DIAGNOSIS: 1. Right shoulder pain secondary to irreparable rotator cuff tear. 2. Left shoulder pain secondary to rotator cuff tear, SLAP lesion, and     frozen shoulder.  PROCEDURE PERFORMED: 1. Right shoulder subacromial cortisone injection. 2. Left shoulder arthroscopy with extensive intra-articular     debridement of torn superior labrum, anterior-posterior,     arthroscopic biceps tenotomy, arthroscopic capsular release,     arthroscopic subacromial decompression followed by mini open     rotator cuff repair, biceps tenodesis in the groove.  ATTENDING SURGEON:  Almedia Balls. Ranell Patrick, M.D.  ASSISTANT:  Donnie Coffin. Dixon, PA-C, who was scrubbed the entire procedure and necessary for satisfactory completion of surgery.  ANESTHESIA:  General anesthesia was used plus interscalene block.  ESTIMATED BLOOD LOSS:  Minimal.  FLUID REPLACEMENT:  1000 mL of crystalloid.  INSTRUMENT COUNTS:  Correct.  COMPLICATIONS:  There were no complications.  ANTIBIOTICS:  Perioperative antibiotics were given.  INDICATIONS:  The patient is an 79 year old female with a history of both left and right shoulder pain.  The patient initially had been worked up for left shoulder rotator cuff tear.  Preoperative clinical evaluation and imaging consistent with a full-thickness rotator cuff tear involving supraspinatus and infraspinatus tendons.  The patient had minimal-to-moderate atrophy in the supraspinatus with  minimal in the infraspinatus.  The patient also had a SLAP lesion in the left shoulder, and she also had a recent MRI scan of the right shoulder due to worsening pain indicating an irreparable rotator cuff tear with superior head migration.  I counseled the patient, both in the office and prior to surgery regarding the need to operatively repair her left rotator cuff to make sure that we give her shoulder she can be active with away from body and overhead.  The right shoulder unfortunately is not repairable and would not recommend an arthroscopic surgery for that at this time, so we offered her an injection, which she did agree to, and informed consent was obtained for the right shoulder injection and the left shoulder repair.  Informed consent was obtained.  DESCRIPTION OF PROCEDURE:  After an adequate level of anesthesia was achieved, the patient was positioned in the modified beach-chair position.  Both shoulders were correctly identified.  The right for injection, we did that first.  We did a time-out, sterile prep of the shoulder, and then a subacromial injection with 1 mL of Kenalog 40 mg with lidocaine and Marcaine.  She tolerated that very well.  Band-aid placed.  We then directed our attention towards the left shoulder, positioned the patient appropriately.  Separate time-out was called. Sterile prep and drape of the shoulder performed.  The patient did  have a stiff shoulder, forward elevation limited about 100 degrees, abduction 90, external rotation is about 60, internal rotation of her abdomen, cross-arm adduction internal rotation tight, but manipulation not very successful getting any extra motion.  Sterile prep and drape performed. Time-out called.  We entered the shoulder using standard portals including anterior, posterior, and lateral portals.  We identified significant capsular inflammation consistent with a frozen shoulder.  We also identified a large SLAP lesion with  an unstable biceps anchor. Performed a biceps tenotomy and labral debridement back to stable labral rim.  Remaining labral tissue was probed and felt to be stable.  The subscapularis rolled edge visualized and normal.  Rotator cuff torn and retract, released within the joint.  Posterior labrum was intact.  After we concluded the labral debridement and the biceps tenotomy, we did a capsular release with the ArthroCare unit focusing on the anterior inferior capsule and middle glenohumeral ligament.  All that was released, much improved range of motion.  The posterior capsule was inflamed but not significantly thickened, we left that alone so as not to create an unstable situation with this patient and her rotator cuff repair which looked to be pretty big.  Once we completed our capsular release, we placed scope in subacromial space.  Thorough bursectomy and acromioplasty was performed creating a type 1 acromial shape with decompression of the outlet of rotator cuff.  We did preserve the medial portion of the CA ligament.  We inspected the rotator cuff which was a V- shaped tear from the bursal surface.  At this point, following completion of ASAD, we went ahead and concluded the arthroscopic portion of surgery and then made a mini open incision starting at the anterolateral border acromion staying distally about 3-4 cm.  Dissection down through subcutaneous tissues, identified the deltoid raphe between anterolateral heads, divided that sharply using needle tip Bovie.  We went ahead and placed Arthrex retractor, identified the biceps groove, delivered the tendon out of the wound, repaired the floor of the bicipital groove with a curette and Freer elevator and then placed a single Y-Knot anchor through the floor of the groove, which was suture only anchor.  We went ahead and used that suture anchor to whipstitch the tendon tying it down, flushed the biceps groove.  We had nice low- profile  repair.  We then directed our attention towards the rotator cuff tear, which was a pretty big supraspinatus and partial infraspinatus tear.  We placed 3 sutures into the edge and then freed it up using Cobb elevator on both sides of the tendon.  We then went ahead and did a combination of margin convergence sutures, which were basically rotator interval sutures which is #2 Hi-Fi and did 2 Y-Knot RC  anchors double loaded with #2 Hi-Fi with mattress sutures of the medial portion of footprint.  We prepared the footprint with rongeur and curette prior to anchoring it down.  Then, we did the sutures out through drill holes laterally.  Additional over-sewing of the anterolateral corner with 0- Vicryl figure-of-eight suture.  Had nice repair, not under significant tension.  We were pleased with the repair gaining full thickness coverage and nice anchoring to that subscapularis up anteriorly.  We went ahead then and thoroughly irrigated the subdeltoid interval, repaired deltoid anatomically with 0-Vicryl suture followed by 2-0 Vicryl for subcutaneous closure and 4-0 Monocryl for skin and portals.  Steri-Strips applied followed by sterile dressing. The patient tolerated the surgery well.     Viviann Spare  Russ Halo, M.D.     SRN/MEDQ  D:  09/20/2012  T:  09/20/2012  Job:  [id number]

## 2012-09-23 ENCOUNTER — Encounter (HOSPITAL_COMMUNITY): Payer: Self-pay | Admitting: Orthopedic Surgery

## 2012-12-18 ENCOUNTER — Other Ambulatory Visit: Payer: Self-pay | Admitting: Neurological Surgery

## 2012-12-18 DIAGNOSIS — M5416 Radiculopathy, lumbar region: Secondary | ICD-10-CM

## 2012-12-26 ENCOUNTER — Ambulatory Visit
Admission: RE | Admit: 2012-12-26 | Discharge: 2012-12-26 | Disposition: A | Discharge status: Home / Self Care | Payer: Medicare Other | Source: Ambulatory Visit | Attending: Neurological Surgery | Admitting: Neurological Surgery

## 2012-12-26 DIAGNOSIS — M5416 Radiculopathy, lumbar region: Secondary | ICD-10-CM

## 2013-12-31 IMAGING — CR DG CHEST 2V
2 series · 2 of 2 positions shown · non-contrast
Comparison: None

CLINICAL DATA: Preop shoulder surgery

CHEST - 2 VIEW

[w chest pa]
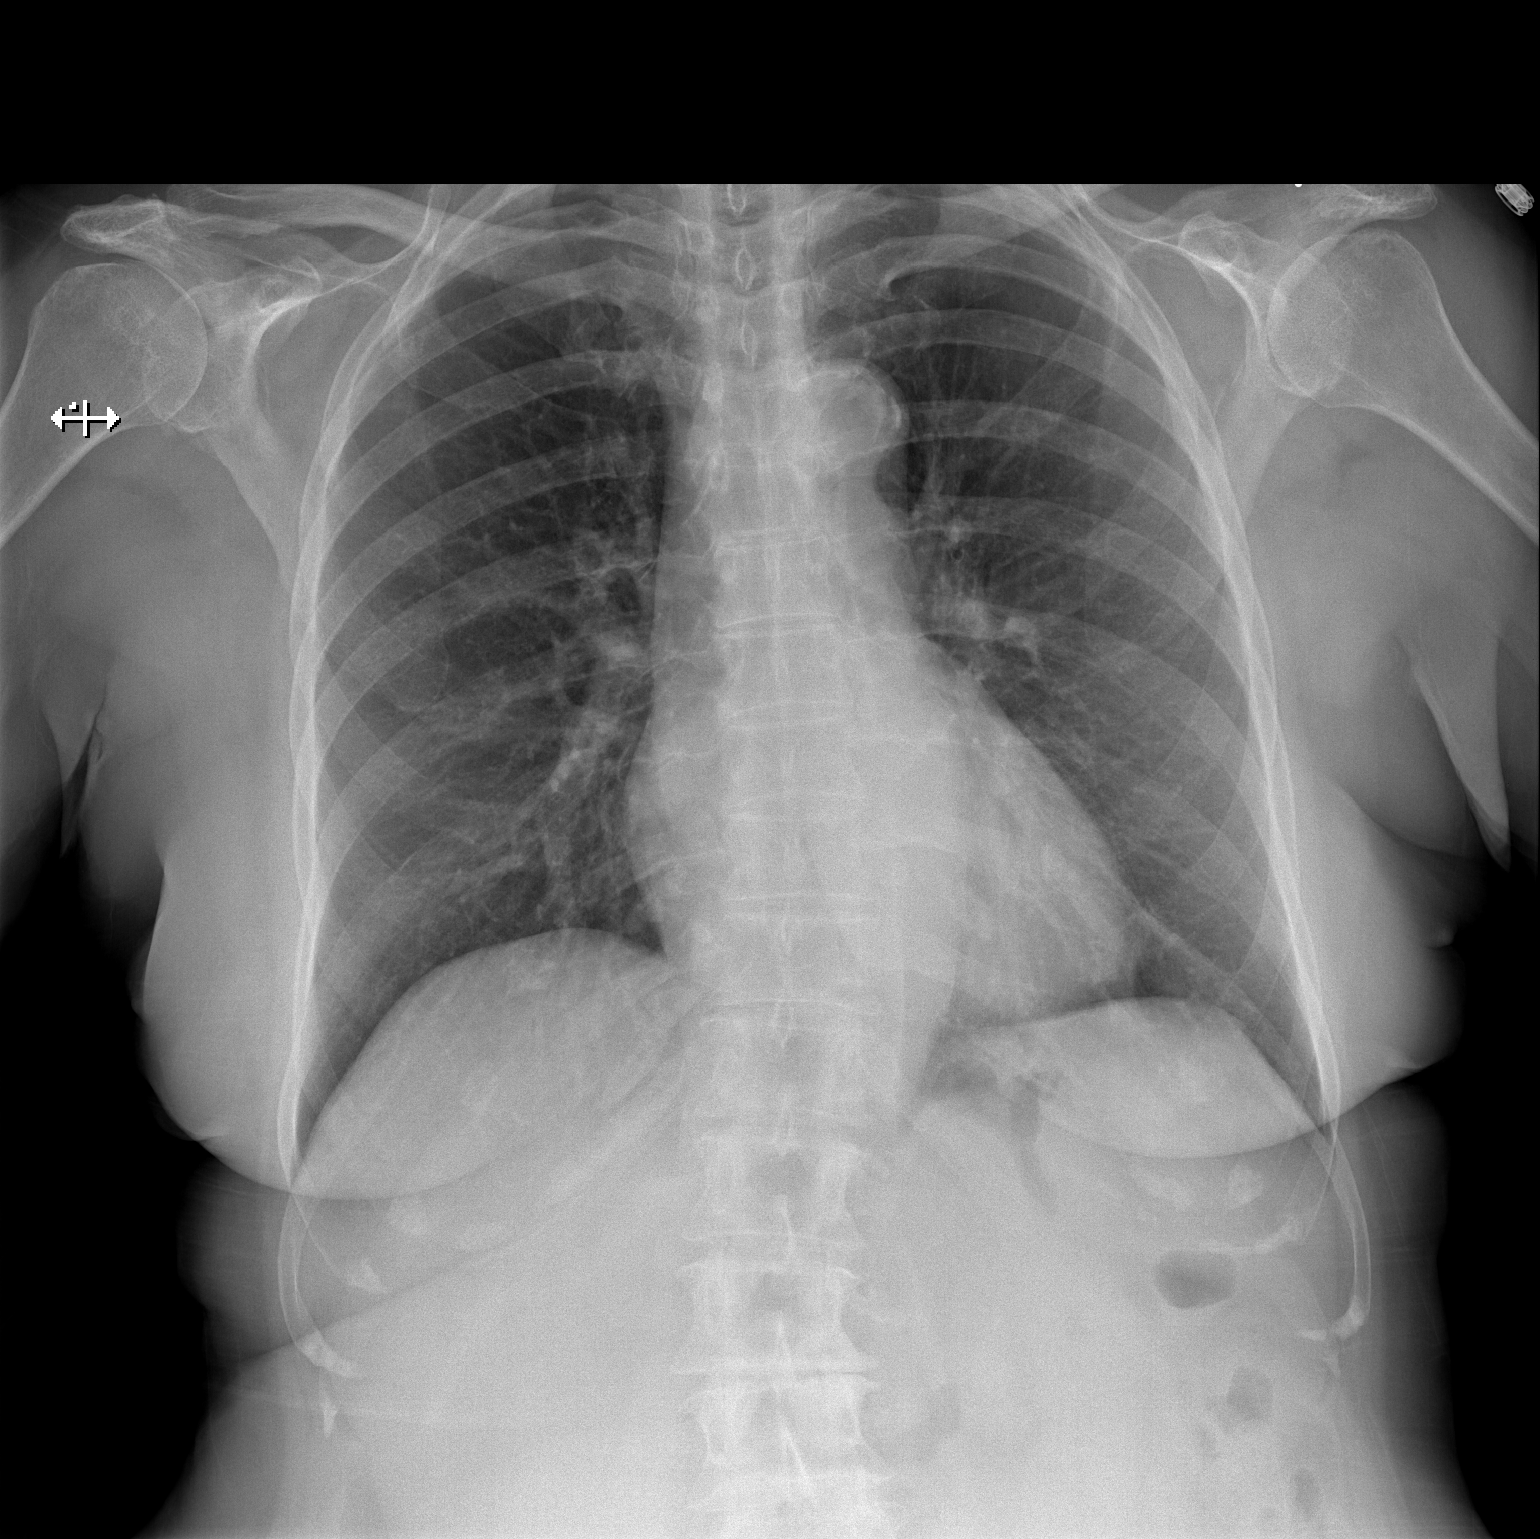

[w chest lat]
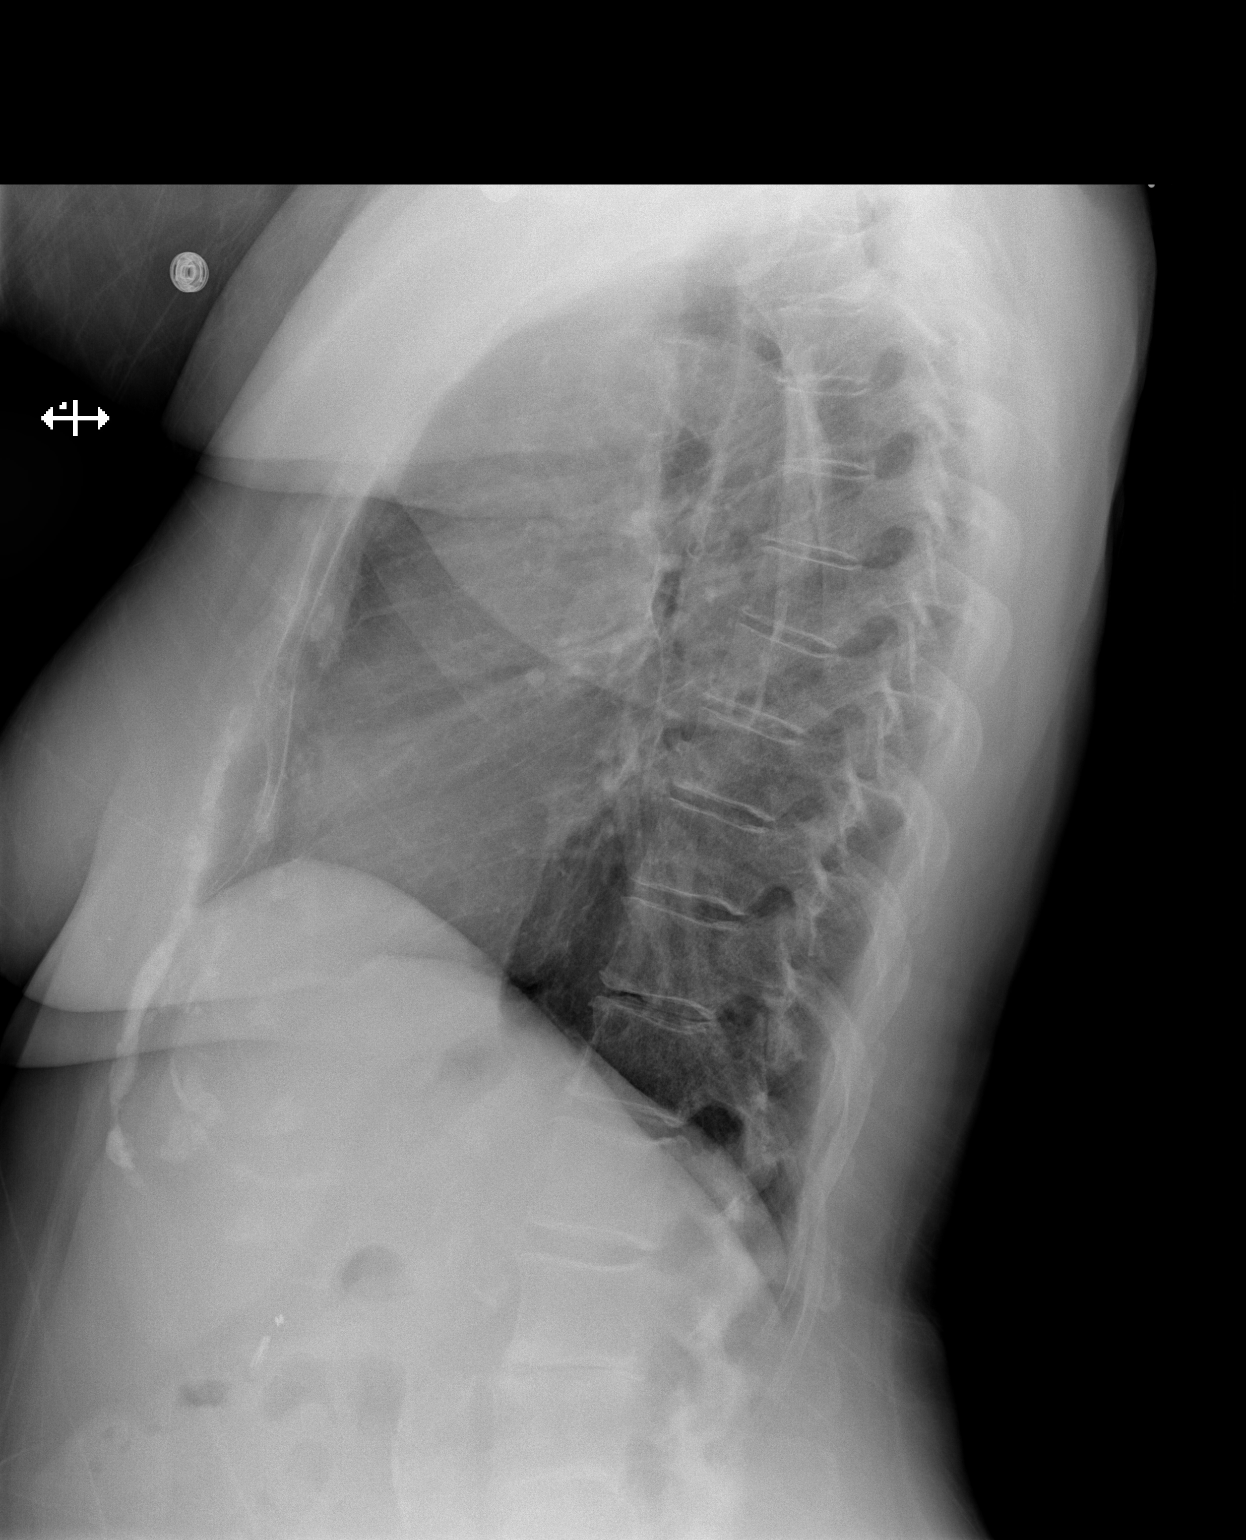

[2 of 2 positions shown; findings below may reference images not displayed]

FINDINGS: Cardiac and mediastinal contours are normal.  Lungs are
clear without infiltrate effusion or mass.  Mild degenerative
change and scoliosis in the upper lumbar spine.
IMPRESSION: No active cardiopulmonary abnormality.

## 2018-02-16 DEATH — deceased
